# Patient Record
Sex: Female | Born: 1990
Health system: Southern US, Community
[De-identification: ages and names within clinical notes are randomized; demographics above are authoritative.]

## PROBLEM LIST (undated history)

## (undated) DIAGNOSIS — N809 Endometriosis, unspecified: Secondary | ICD-10-CM

## (undated) HISTORY — PX: CHOLECYSTECTOMY: SHX55

## (undated) HISTORY — DX: Endometriosis, unspecified: N80.9

---

## 2013-07-07 ENCOUNTER — Ambulatory Visit (INDEPENDENT_AMBULATORY_CARE_PROVIDER_SITE_OTHER): Payer: BC Managed Care – PPO | Admitting: Family Medicine

## 2013-07-07 ENCOUNTER — Ambulatory Visit: Payer: BC Managed Care – PPO

## 2013-07-07 VITALS — BP 118/60 | HR 78 | Resp 16

## 2013-07-07 DIAGNOSIS — S99919A Unspecified injury of unspecified ankle, initial encounter: Secondary | ICD-10-CM

## 2013-07-07 DIAGNOSIS — S8990XA Unspecified injury of unspecified lower leg, initial encounter: Secondary | ICD-10-CM

## 2013-07-07 DIAGNOSIS — S99911A Unspecified injury of right ankle, initial encounter: Secondary | ICD-10-CM

## 2013-07-07 DIAGNOSIS — S93409A Sprain of unspecified ligament of unspecified ankle, initial encounter: Secondary | ICD-10-CM

## 2013-07-07 DIAGNOSIS — S99929A Unspecified injury of unspecified foot, initial encounter: Secondary | ICD-10-CM

## 2013-07-07 DIAGNOSIS — S93401A Sprain of unspecified ligament of right ankle, initial encounter: Secondary | ICD-10-CM

## 2013-07-07 DIAGNOSIS — M25579 Pain in unspecified ankle and joints of unspecified foot: Secondary | ICD-10-CM

## 2013-07-07 NOTE — Patient Instructions (Signed)
Take either ibuprofen 6-800 mg 3 times daily or Aleve (naproxen) 2 twice daily as needed for pain  Elevate foot as possible  Wear a Cam Walker for a couple of weeks. If much better in 10-14 days may stop wearing the Cam Walker. However if the pain continues to be significant at that point you should return for a recheck.  Use ice on ankle for 4- 5 times daily for 15 or 20 minutes.  Return at any time if worse

## 2013-07-07 NOTE — Progress Notes (Signed)
Subjective: Patient stepped off a curb and fell, hearing a pop in her right ankle. She has a lot of pain. She did take some ibuprofen promptly. She is a [employer] student. A friend drove her here. She is on medicine for endometriosis, does not menstruate, and has no concern or risk of pregnancy.  Objective:  Very painful right ankle. She feels better with an elevated. She's very tender on the lateral collateral malleolus. Pedal pulses are good. Sensation normal. Some early ecchymosis below the lateral malleolus.  Assessment: Ankle pain  Plan: X-ray ankle  [facility] reading (PRIMARY) by  Dr. Alwyn RenHopper Normal ankle  Cam Dan HumphreysWalker. Then we will see if she needs crutches..Marland Kitchen

## 2014-08-19 IMAGING — CR DG ANKLE COMPLETE 3+V*R*
2 series · 2 of 2 positions shown · non-contrast
Comparison: None.

CLINICAL DATA: Pain post trauma

EXAM:
RIGHT ANKLE - COMPLETE 3+ VIEW

[AP (1 of 2)]
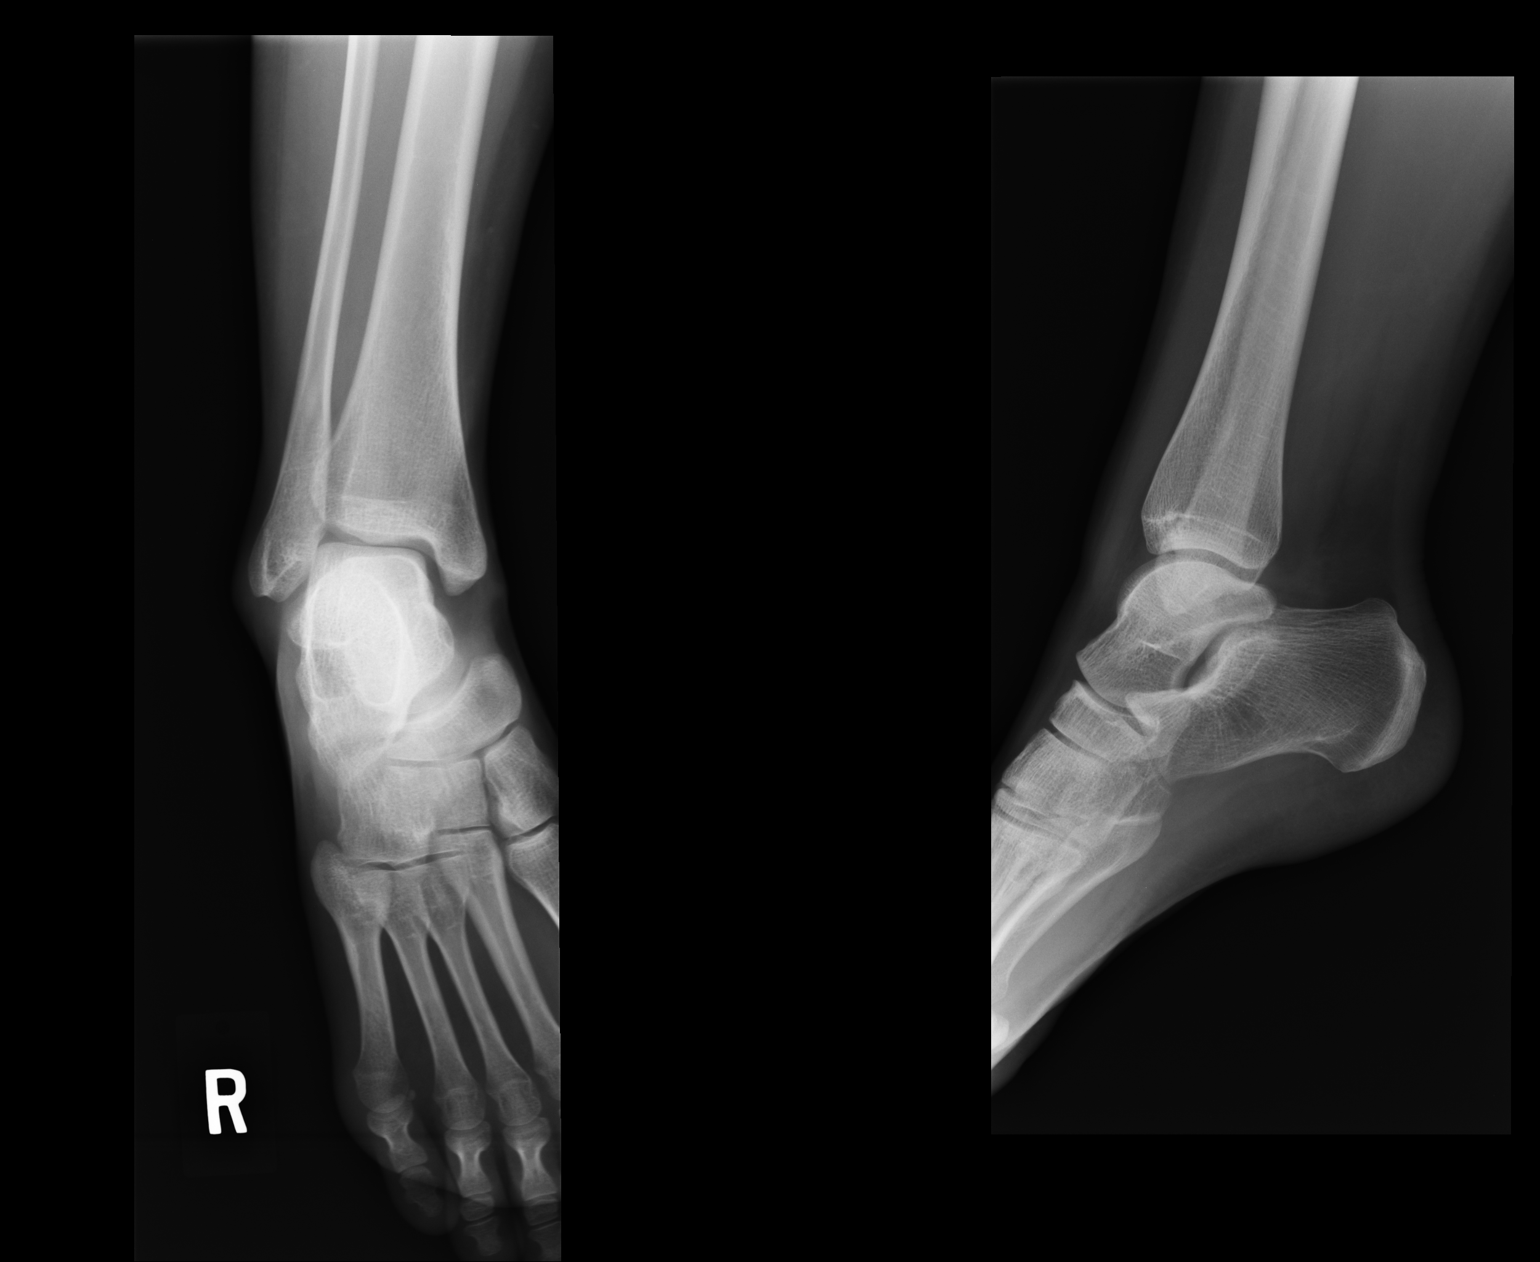

[AP (2 of 2)]
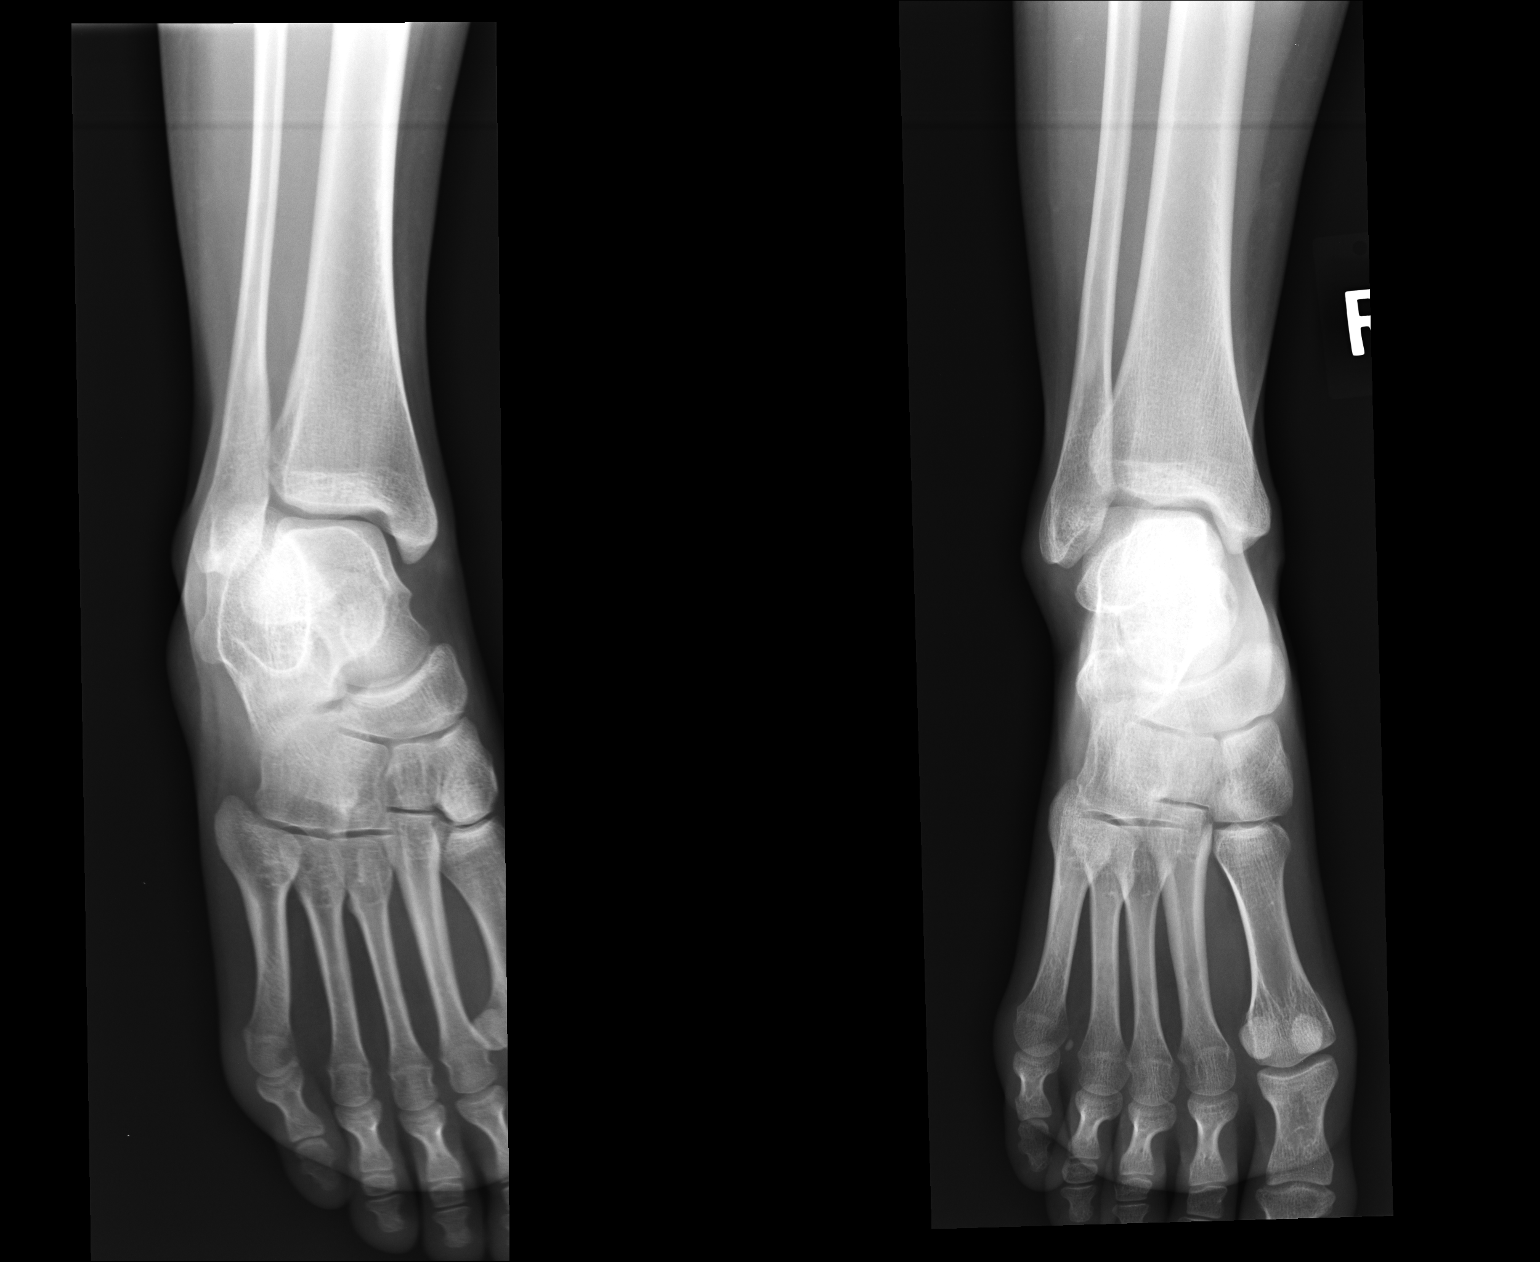

[2 of 2 positions shown; findings below may reference images not displayed]

FINDINGS: Frontal, oblique, and lateral views were obtained. There is no
fracture or effusion. Ankle mortise appears intact.
IMPRESSION: No abnormality noted.

## 2017-06-26 ENCOUNTER — Encounter: Payer: Self-pay | Admitting: Family Medicine

## 2017-06-26 ENCOUNTER — Ambulatory Visit (INDEPENDENT_AMBULATORY_CARE_PROVIDER_SITE_OTHER): Payer: 59 | Admitting: Family Medicine

## 2017-06-26 VITALS — BP 118/70 | HR 71 | Temp 97.9°F | Resp 12 | Ht 66.0 in | Wt 144.4 lb

## 2017-06-26 DIAGNOSIS — Z131 Encounter for screening for diabetes mellitus: Secondary | ICD-10-CM | POA: Diagnosis not present

## 2017-06-26 DIAGNOSIS — N809 Endometriosis, unspecified: Secondary | ICD-10-CM | POA: Diagnosis not present

## 2017-06-26 DIAGNOSIS — H538 Other visual disturbances: Secondary | ICD-10-CM | POA: Diagnosis not present

## 2017-06-26 LAB — POCT GLYCOSYLATED HEMOGLOBIN (HGB A1C): Hemoglobin A1C: 5.1

## 2017-06-26 NOTE — Patient Instructions (Addendum)
A few things to remember from today's visit:   Blurry vision, bilateral - Plan: POCT glycosylated hemoglobin (Hb A1C)  Diabetes mellitus screening  You may need eye glasses, so please arrange eye exam with eye care provider.Marland Kitchen.  Keep appt with gyn.  Lab Results  Component Value Date   HGBA1C 5.1 06/26/2017    Please be sure medication list is accurate. If a new problem present, please set up appointment sooner than planned today.

## 2017-06-26 NOTE — Progress Notes (Signed)
HPI:   [name] is a 27 y.o. female, who is here today to establish care.  Former PCP: Dr Harle Stanford at [place] ,[place] Last preventive routine visit: 2017  Chronic medical problems: Otherwise healthy except for history of endometriosis, overall well control on OCPs.  She has not had a pap smear. Vaccinations reported as up to date, Tdap a couple years ago.  Concerns today: Blurry vision, she would like a vision screening.  For about a year she has noted blurry vision,contant and just with distant vision. She does not have problems with reading or working on computers. She has difficulty reading signs that are far away. Gradual onset. Problem has bee stable. No associated headache,eye pain or discharge,conjunctival erythema,eye dryness or pruritus. No nasal congestion or rhinorrhea. She has not tried OTC eye products.   Review of Systems  Constitutional: Negative for activity change, appetite change, fatigue and fever.  HENT: Negative for mouth sores, nosebleeds and trouble swallowing.   Eyes: Positive for visual disturbance. Negative for pain, discharge, redness and itching.  Respiratory: Negative for cough, shortness of breath and wheezing.   Cardiovascular: Negative for chest pain, palpitations and leg swelling.  Gastrointestinal: Negative for abdominal pain, nausea and vomiting.       Negative for changes in bowel habits.  Endocrine: Negative for polydipsia, polyphagia and polyuria.  Skin: Negative for rash.  Allergic/Immunologic: Negative for environmental allergies.  Neurological: Negative for syncope, weakness and headaches.      Current Outpatient Medications on File Prior to Visit  Medication Sig Dispense Refill  . WYMZYA FE 0.4-35 MG-MCG tablet      No current facility-administered medications on file prior to visit.      Past Medical History:  Diagnosis Date  . Endometriosis    Allergies  Allergen Reactions  . Other Rash    Anything  in the cillin family. Nausea, vomiting  . Shellfish Allergy Other (See Comments) and Shortness Of Breath    Other reaction(s): Itching, Rash -cillins  . Cefuroxime Axetil     Other reaction(s): Itching    Family History  Problem Relation Age of Onset  . Diabetes Mother     Social History   Socioeconomic History  . Marital status: Single    Spouse name: None  . Number of children: None  . Years of education: None  . Highest education level: None  Social Needs  . Financial resource strain: None  . Food insecurity - worry: None  . Food insecurity - inability: None  . Transportation needs - medical: None  . Transportation needs - non-medical: None  Occupational History  . None  Tobacco Use  . Smoking status: Never Smoker  . Smokeless tobacco: Never Used  Substance and Sexual Activity  . Alcohol use: Yes  . Drug use: No  . Sexual activity: No  Other Topics Concern  . None  Social History Narrative  . None    Vitals:   06/26/17 1220  BP: 118/70  Pulse: 71  Resp: 12  Temp: 97.9 F (36.6 C)  SpO2: 98%    Body mass index is 23.3 kg/m.   Physical Exam  Nursing note and vitals reviewed. Constitutional: She is oriented to person, place, and time. She appears well-developed and well-nourished. No distress.  HENT:  Head: Normocephalic and atraumatic.  Mouth/Throat: Oropharynx is clear and moist and mucous membranes are normal.  Eyes: Conjunctivae and EOM are normal. Pupils are equal, round, and reactive to light.  Neck: No tracheal deviation present. No thyroid mass and no thyromegaly present.  Cardiovascular: Normal rate and regular rhythm.  No murmur heard. Respiratory: Effort normal and breath sounds normal. No respiratory distress.  GI: Soft. She exhibits no mass. There is no hepatomegaly. There is no tenderness.  Musculoskeletal: She exhibits no edema or tenderness.  Lymphadenopathy:    She has no cervical adenopathy.  Neurological: She is alert and  oriented to person, place, and time. She has normal strength. Coordination normal.  Skin: Skin is warm. No rash noted. No erythema.  Psychiatric: She has a normal mood and affect.  Well groomed, good eye contact.    ASSESSMENT AND PLAN:   Ms. Sheri Pratt was seen today for establish care.  Diagnoses and all orders for this visit:  Lab Results  Component Value Date   HGBA1C 5.1 06/26/2017    Blurry vision, bilateral  Possible etiologies discussed, stable overall. ? Refractive eye problem. Vision screening normal but had some difficulty reading 20/20. Still strongly recommend evaluation by eye care provider.    Visual Acuity Screening   Right eye Left eye Both eyes  Without correction: 20/20 20/20 20/20   With correction:      Instructed about warning signs.  -     POCT glycosylated hemoglobin (Hb A1C)  Diabetes mellitus screening -     POCT glycosylated hemoglobin (Hb A1C)  Endometriosis  Stable. She has an appt with gyn next week.   We dicussed current recommendations in regard to cervical cancer screening.     Jase Himmelberger G. [place], MD  [facility]. [place] office.

## 2017-07-03 DIAGNOSIS — Z6823 Body mass index (BMI) 23.0-23.9, adult: Secondary | ICD-10-CM | POA: Diagnosis not present

## 2017-07-03 DIAGNOSIS — Z01419 Encounter for gynecological examination (general) (routine) without abnormal findings: Secondary | ICD-10-CM | POA: Diagnosis not present

## 2017-07-15 ENCOUNTER — Ambulatory Visit (INDEPENDENT_AMBULATORY_CARE_PROVIDER_SITE_OTHER): Payer: 59 | Admitting: Family Medicine

## 2017-07-15 ENCOUNTER — Encounter: Payer: Self-pay | Admitting: *Deleted

## 2017-07-15 ENCOUNTER — Encounter: Payer: Self-pay | Admitting: Family Medicine

## 2017-07-15 VITALS — BP 116/72 | HR 111 | Temp 98.9°F | Resp 12 | Ht 66.0 in | Wt 146.2 lb

## 2017-07-15 DIAGNOSIS — R509 Fever, unspecified: Secondary | ICD-10-CM | POA: Diagnosis not present

## 2017-07-15 DIAGNOSIS — J069 Acute upper respiratory infection, unspecified: Secondary | ICD-10-CM

## 2017-07-15 DIAGNOSIS — R112 Nausea with vomiting, unspecified: Secondary | ICD-10-CM | POA: Diagnosis not present

## 2017-07-15 DIAGNOSIS — J029 Acute pharyngitis, unspecified: Secondary | ICD-10-CM | POA: Diagnosis not present

## 2017-07-15 LAB — POC INFLUENZA A&B (BINAX/QUICKVUE)
INFLUENZA A, POC: NEGATIVE
INFLUENZA B, POC: NEGATIVE

## 2017-07-15 LAB — POCT RAPID STREP A (OFFICE): Rapid Strep A Screen: NEGATIVE

## 2017-07-15 MED ORDER — ONDANSETRON HCL 4 MG PO TABS: 4.0000 mg | ORAL_TABLET | Freq: Three times a day (TID) | ORAL | 0 refills | Status: DC | PRN

## 2017-07-15 MED ORDER — MAGIC MOUTHWASH W/LIDOCAINE: 5.0000 mL | Freq: Three times a day (TID) | ORAL | 0 refills | Status: AC | PRN

## 2017-07-15 MED ORDER — BENZONATATE 100 MG PO CAPS: 200.0000 mg | ORAL_CAPSULE | Freq: Two times a day (BID) | ORAL | 0 refills | Status: AC | PRN

## 2017-07-15 NOTE — Progress Notes (Signed)
ACUTE VISIT  HPI:  Chief Complaint  Patient presents with  . Cough  . Nasal Congestion  . Sore Throat  . Fever    started Sunday, 101.8 this morning  . Emesis    on Tuesday  . Generalized Body Aches    [name] is a 27 y.o.female here today complaining of 3 days of fever. Yesterday episode of vomiting. Still having mild nausea. No abdominal pain or diarrhea.  Nausea aggravated by food intake. No alleviated factors identified.   URI   This is a new problem. The current episode started in the past 7 days. The problem has been unchanged. The maximum temperature recorded prior to her arrival was 101 - 101.9 F. Associated symptoms include congestion, coughing, nausea, a plugged ear sensation, rhinorrhea, a sore throat and vomiting. Pertinent negatives include no abdominal pain, diarrhea, dysuria, ear pain, headaches, neck pain, rash, swollen glands or wheezing. She has tried NSAIDs for the symptoms. The treatment provided mild relief.    No Hx of recent travel. 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 No known insect bite.  Hx of allergies: No  Symptoms otherwise stable.    Review of Systems  Constitutional: Positive for activity change, appetite change, chills, fatigue and fever.  HENT: Positive for congestion, postnasal drip, rhinorrhea, sinus pressure and sore throat. Negative for ear pain, mouth sores, trouble swallowing and voice change.   Eyes: Negative for discharge, redness and itching.  Respiratory: Positive for cough. Negative for shortness of breath and wheezing.   Cardiovascular: Negative for leg swelling.  Gastrointestinal: Positive for nausea and vomiting. Negative for abdominal pain and diarrhea.  Genitourinary: Negative for dysuria.  Musculoskeletal: Positive for myalgias. Negative for gait problem and neck pain.  Skin: Negative for rash.  Allergic/Immunologic: Negative for environmental allergies.  Neurological: Negative for  syncope, weakness and headaches.  Hematological: Negative for adenopathy. Does not bruise/bleed easily.      Current Outpatient Medications on File Prior to Visit  Medication Sig Dispense Refill  . WYMZYA FE 0.4-35 MG-MCG tablet      No current facility-administered medications on file prior to visit.      Past Medical History:  Diagnosis Date  . Endometriosis    Allergies  Allergen Reactions  . Other Rash    Anything in the cillin family. Nausea, vomiting  . Cefuroxime Axetil     Other reaction(s): Itching    Social History   Socioeconomic History  . Marital status: Single    Spouse name: None  . Number of children: None  . Years of education: None  . Highest education level: None  Social Needs  . Financial resource strain: None  . Food insecurity - worry: None  . Food insecurity - inability: None  . Transportation needs - medical: None  . Transportation needs - non-medical: None  Occupational History  . None  Tobacco Use  . Smoking status: Never Smoker  . Smokeless tobacco: Never Used  Substance and Sexual Activity  . Alcohol use: Yes  . Drug use: No  . Sexual activity: No  Other Topics Concern  . None  Social History Narrative  . None    Vitals:   07/15/17 1036  BP: 116/72  Pulse: (!) 111  Resp: 12  Temp: 98.9 F (37.2 C)  SpO2: 99%   Body mass index is 23.61 kg/m.    Physical Exam  Nursing note and vitals reviewed. Constitutional: She is oriented  to person, place, and time. She appears well-developed and well-nourished. She does not appear ill. No distress.  HENT:  Head: Normocephalic and atraumatic.  Right Ear: Tympanic membrane, external ear and ear canal normal.  Left Ear: Tympanic membrane, external ear and ear canal normal.  Nose: Rhinorrhea and septal deviation present. Right sinus exhibits no maxillary sinus tenderness and no frontal sinus tenderness. Left sinus exhibits no maxillary sinus tenderness and no frontal sinus  tenderness.  Mouth/Throat: Uvula is midline and mucous membranes are normal. Posterior oropharyngeal erythema present. No oropharyngeal exudate or posterior oropharyngeal edema.  Hypertrophic turbinates. Post nasal drainage.  Eyes: Conjunctivae are normal.  Neck: No muscular tenderness present. No edema and no erythema present.  Cardiovascular: Regular rhythm. Tachycardia present.  No murmur heard. Respiratory: Effort normal and breath sounds normal. No stridor. No respiratory distress.  Lymphadenopathy:    She has cervical adenopathy.       Right cervical: Posterior cervical adenopathy present.       Left cervical: Posterior cervical adenopathy present.  Neurological: She is alert and oriented to person, place, and time. She has normal strength.  Skin: Skin is warm. No rash noted. No erythema.  Psychiatric: She has a normal mood and affect. Her speech is normal.  Well groomed, good eye contact.     ASSESSMENT AND PLAN:   Ms. [name] was seen today for cough, nasal congestion, sore throat, fever, emesis and generalized body aches.  Diagnoses and all orders for this visit:  Sore throat  Rapid strep negative. We will follow strep Cx. Symptomatic treatment recommended.  -     POC Influenza A&B (Binax test) -     POCT rapid strep A -     Culture, Group A Strep -     magic mouthwash w/lidocaine SOLN; Take 5 mLs by mouth 3 (three) times daily as needed for up to 10 days for mouth pain. 50 ml of diphenhydramine, alum and mag hydroxide, and lidocaine to make 150 ml  Fever, unspecified fever cause  Rapid flu and strep negative.  -     POC Influenza A&B (Binax test) -     POCT rapid strep A -     Culture, Group A Strep  URI, acute  Symptoms suggests a viral etiology, symptomatic treatment recommended. Istructed to monitor for signs of complications, clearly instructed about warning signs. Excuse note for work given. I also explained that cough and nasal congestion can last a  few days and sometimes weeks. F/U as needed.   -     POC Influenza A&B (Binax test) -     POCT rapid strep A -     Culture, Group A Strep -     benzonatate (TESSALON) 100 MG capsule; Take 2 capsules (200 mg total) by mouth 2 (two) times daily as needed for up to 10 days.  Nausea and vomiting in adult  Zofran recommended. Small and frequent sips of clear fluids during the day. Instructed about warning signs.  -     ondansetron (ZOFRAN) 4 MG tablet; Take 1 tablet (4 mg total) by mouth every 8 (eight) hours as needed for nausea or vomiting.      -Ms. Devonia Farro Richison was advised to seek attention immediately if symptoms worsen or to follow if they persist or new concerns arise.       Betty G. [place], MD  [facility]. [place] office.

## 2017-07-15 NOTE — Patient Instructions (Addendum)
A few things to remember from today's visit:   Sore throat - Plan: POC Influenza A&B (Binax test), POCT rapid strep A, Culture, Group A Strep, magic mouthwash w/lidocaine SOLN  Fever, unspecified fever cause - Plan: POC Influenza A&B (Binax test), POCT rapid strep A, Culture, Group A Strep  URI, acute - Plan: POC Influenza A&B (Binax test), POCT rapid strep A, Culture, Group A Strep, benzonatate (TESSALON) 100 MG capsule  Nausea and vomiting in adult - Plan: ondansetron (ZOFRAN) 4 MG tablet  viral infections are self-limited and we treat each symptom depending of severity.  Over the counter medications as decongestants and cold medications usually help, they need to be taken with caution if there is a history of high blood pressure or palpitations. Tylenol and/or Ibuprofen also helps with most symptoms (headache, muscle aching, fever,etc) Plenty of fluids. Honey helps with cough. Steam inhalations helps with runny nose, nasal congestion, and may prevent sinus infections. Cough and nasal congestion could last a few days and sometimes weeks. Please follow in not any better in 1-2 weeks or if symptoms get worse.  Please be sure medication list is accurate. If a new problem present, please set up appointment sooner than planned today.

## 2017-07-17 LAB — CULTURE, GROUP A STREP
MICRO NUMBER: 90224601
SPECIMEN QUALITY:: ADEQUATE

## 2018-07-15 MED FILL — NORET-ESTR-FE 0.4-0.035(21): 0.4-35 | 84 days supply | Qty: 84 | Fill #0

## 2018-07-23 ENCOUNTER — Telehealth: Payer: 59 | Admitting: Family

## 2018-07-23 DIAGNOSIS — J069 Acute upper respiratory infection, unspecified: Secondary | ICD-10-CM

## 2018-07-23 MED ORDER — FLUTICASONE PROPIONATE 50 MCG/ACT NA SUSP: 1.0000 | Freq: Two times a day (BID) | NASAL | 6 refills | Status: DC

## 2018-07-23 MED ORDER — BENZONATATE 100 MG PO CAPS: 100.0000 mg | ORAL_CAPSULE | Freq: Three times a day (TID) | ORAL | 0 refills | Status: DC | PRN

## 2018-07-23 MED FILL — BENZONATATE 100 MG CAPS: 100 | 5 days supply | Qty: 30 | Fill #0

## 2018-07-23 MED FILL — FLUTICASONE PROP 50 MCG SPR: 50 | 30 days supply | Qty: 16 | Fill #0

## 2018-07-23 NOTE — Progress Notes (Signed)
Greater than 5 minutes, yet less than 10 minutes of time have been spent researching, coordinating, and implementing care for this patient today.  Thank you for the details you included in the comment boxes. Those details are very helpful in determining the best course of treatment for you and help [place] to provide the best care.  This is more likely to be another viral respiratory infection. See below.  We are sorry you are not feeling well.  Here is how we plan to help!  Based on what you have shared with me, it looks like you may have a viral upper respiratory infection.  Upper respiratory infections are caused by a large number of viruses; however, rhinovirus is the most common cause.   Symptoms vary from person to person, with common symptoms including sore throat, cough, and fatigue or lack of energy.  A low-grade fever of up to 100.4 may present, but is often uncommon.  Symptoms vary however, and are closely related to a person's age or underlying illnesses.  The most common symptoms associated with an upper respiratory infection are nasal discharge or congestion, cough, sneezing, headache and pressure in the ears and face.  These symptoms usually persist for about 3 to 10 days, but can last up to 2 weeks.  It is important to know that upper respiratory infections do not cause serious illness or complications in most cases.    Upper respiratory infections can be transmitted from person to person, with the most common method of transmission being a person's hands.  The virus is able to live on the skin and can infect other persons for up to 2 hours after direct contact.  Also, these can be transmitted when someone coughs or sneezes; thus, it is important to cover the mouth to reduce this risk.  To keep the spread of the illness at bay, good hand hygiene is very important.  This is an infection that is most likely caused by a virus. There are no specific treatments other than to help you with the  symptoms until the infection runs its course.  We are sorry you are not feeling well.  Here is how we plan to help!   For nasal congestion, you may use an oral decongestants such as Mucinex D or if you have glaucoma or high blood pressure use plain Mucinex.  Saline nasal spray or nasal drops can help and can safely be used as often as needed for congestion.  For your congestion, I have prescribed Fluticasone nasal spray one spray in each nostril twice a day  If you do not have a history of heart disease, hypertension, diabetes or thyroid disease, prostate/bladder issues or glaucoma, you may also use Sudafed to treat nasal congestion.  It is highly recommended that you consult with a pharmacist or your primary care physician to ensure this medication is safe for you to take.     If you have a cough, you may use cough suppressants such as Delsym and Robitussin.  If you have glaucoma or high blood pressure, you can also use Coricidin HBP.   For cough I have prescribed for you A prescription cough medication called Tessalon Perles 100 mg. You may take 1-2 capsules every 8 hours as needed for cough  If you have a sore or scratchy throat, use a saltwater gargle-  to  teaspoon of salt dissolved in a 4-ounce to 8-ounce glass of warm water.  Gargle the solution for approximately 15-30 seconds and then spit.  It is important not to swallow the solution.  You can also use throat lozenges/cough drops and Chloraseptic spray to help with throat pain or discomfort.  Warm or cold liquids can also be helpful in relieving throat pain.  For headache, pain or general discomfort, you can use Ibuprofen or Tylenol as directed.   Some authorities believe that zinc sprays or the use of Echinacea may shorten the course of your symptoms.   HOME CARE . Only take medications as instructed by your medical team. . Be sure to drink plenty of fluids. Water is fine as well as fruit juices, sodas and electrolyte beverages. You may  want to stay away from caffeine or alcohol. If you are nauseated, try taking small sips of liquids. How do you know if you are getting enough fluid? Your urine should be a pale yellow or almost colorless. . Get rest. . Taking a steamy shower or using a humidifier may help nasal congestion and ease sore throat pain. You can place a towel over your head and breathe in the steam from hot water coming from a faucet. . Using a saline nasal spray works much the same way. . Cough drops, hard candies and sore throat lozenges may ease your cough. . Avoid close contacts especially the very young and the elderly . Cover your mouth if you cough or sneeze . Always remember to wash your hands.   GET HELP RIGHT AWAY IF: . You develop worsening fever. . If your symptoms do not improve within 10 days . You develop yellow or green discharge from your nose over 3 days. . You have coughing fits . You develop a severe head ache or visual changes. . You develop shortness of breath, difficulty breathing or start having chest pain . Your symptoms persist after you have completed your treatment plan  MAKE SURE YOU   Understand these instructions.  Will watch your condition.  Will get help right away if you are not doing well or get worse.  Your e-visit answers were reviewed by a board certified advanced clinical practitioner to complete your personal care plan. Depending upon the condition, your plan could have included both over the counter or prescription medications. Please review your pharmacy choice. If there is a problem, you may call our nursing hot line at and have the prescription routed to another pharmacy. Your safety is important to [place]. If you have drug allergies check your prescription carefully.   You can use MyChart to ask questions about today's visit, request a non-urgent call back, or ask for a work or school excuse for 24 hours related to this e-Visit. If it has been greater than 24 hours you  will need to follow up with your provider, or enter a new e-Visit to address those concerns. You will get an e-mail in the next two days asking about your experience.  I hope that your e-visit has been valuable and will speed your recovery. Thank you for using e-visits.

## 2018-11-25 MED FILL — NORET-ESTR-FE 0.4-0.035(21): 0.4-35 | 63 days supply | Qty: 84 | Fill #1

## 2019-02-08 MED FILL — NORET-ESTR-FE 0.4-0.035(21): 0.4-35 | 63 days supply | Qty: 84 | Fill #2

## 2019-04-28 MED FILL — WYMZYA FE CHEWABLE TABLET: 0.4-35 | 63 days supply | Qty: 84 | Fill #3

## 2019-07-22 MED FILL — WYMZYA FE CHEWABLE TABLET: 0.4-35 | 21 days supply | Qty: 28 | Fill #0

## 2019-08-16 MED FILL — WYMZYA FE 0.4-35 MG-MCG CHE: 0.4-35 | 21 days supply | Qty: 28 | Fill #0

## 2019-09-20 MED FILL — WYMZYA FE 0.4-35 MG-MCG CHE: 0.4-35 | 21 days supply | Qty: 28 | Fill #1

## 2019-09-21 ENCOUNTER — Other Ambulatory Visit (HOSPITAL_COMMUNITY): Payer: Self-pay | Admitting: Obstetrics

## 2019-10-25 MED FILL — WYMZYA FE 0.4-35 MG-MCG CHE: 0.4-35 | 84 days supply | Qty: 112 | Fill #0

## 2020-02-06 MED FILL — WYMZYA FE 0.4-35 MG-MCG CHE: 0.4-35 | 84 days supply | Qty: 112 | Fill #1

## 2020-04-03 ENCOUNTER — Ambulatory Visit: Payer: No Typology Code available for payment source | Attending: Internal Medicine

## 2020-04-03 DIAGNOSIS — Z23 Encounter for immunization: Secondary | ICD-10-CM

## 2020-04-03 NOTE — Progress Notes (Signed)
   Covid-19 Vaccination Clinic  Name:  Sheri Pratt    MRN: [id number] DOB: 1990/09/18  04/03/2020  [name] was observed post Covid-19 immunization for 15 minutes without incident. She was provided with Vaccine Information Sheet and instruction to access the V-Safe system.   [name] was instructed to call 911 with any severe reactions post vaccine: Marland Kitchen Difficulty breathing  . Swelling of face and throat  . A fast heartbeat  . A bad rash all over body  . Dizziness and weakness

## 2020-05-24 MED FILL — WYMZYA FE 0.4-35 MG-MCG CHE: 0.4-35 | 84 days supply | Qty: 112 | Fill #2

## 2020-09-07 ENCOUNTER — Other Ambulatory Visit (HOSPITAL_COMMUNITY): Payer: Self-pay

## 2020-09-07 MED FILL — Norethindrone & Ethinyl Estradiol-Fe Chew Tab 0.4 MG-35 MCG: ORAL | 84 days supply | Qty: 112 | Fill #0 | Status: AC

## 2020-09-10 ENCOUNTER — Other Ambulatory Visit (HOSPITAL_COMMUNITY): Payer: Self-pay

## 2020-09-11 ENCOUNTER — Other Ambulatory Visit (HOSPITAL_COMMUNITY): Payer: Self-pay

## 2020-10-03 ENCOUNTER — Other Ambulatory Visit (HOSPITAL_COMMUNITY): Payer: Self-pay

## 2020-10-03 MED ORDER — NORETHIN-ETH ESTRADIOL-FE 0.4-35 MG-MCG PO CHEW
1.0000 | CHEWABLE_TABLET | Freq: Every day | ORAL | 3 refills | Status: DC
  Filled 2020-10-03: qty 112, 84d supply, fill #0
  Filled 2020-12-28: qty 84, 84d supply, fill #0
  Filled 2021-03-20: qty 84, 63d supply, fill #1
  Filled 2021-06-12: qty 84, 63d supply, fill #2
  Filled 2021-09-07: qty 84, 63d supply, fill #3

## 2020-10-09 LAB — HM PAP SMEAR: HM Pap smear: NORMAL

## 2020-12-28 ENCOUNTER — Other Ambulatory Visit (HOSPITAL_COMMUNITY): Payer: Self-pay

## 2020-12-31 ENCOUNTER — Other Ambulatory Visit (HOSPITAL_COMMUNITY): Payer: Self-pay

## 2021-01-23 ENCOUNTER — Other Ambulatory Visit (HOSPITAL_COMMUNITY): Payer: Self-pay

## 2021-03-20 ENCOUNTER — Other Ambulatory Visit (HOSPITAL_COMMUNITY): Payer: Self-pay

## 2021-03-21 ENCOUNTER — Other Ambulatory Visit (HOSPITAL_COMMUNITY): Payer: Self-pay

## 2021-06-12 ENCOUNTER — Other Ambulatory Visit (HOSPITAL_COMMUNITY): Payer: Self-pay

## 2021-09-09 ENCOUNTER — Other Ambulatory Visit (HOSPITAL_COMMUNITY): Payer: Self-pay

## 2021-11-16 ENCOUNTER — Other Ambulatory Visit (HOSPITAL_COMMUNITY): Payer: Self-pay

## 2021-11-16 DIAGNOSIS — T7840XA Allergy, unspecified, initial encounter: Secondary | ICD-10-CM | POA: Insufficient documentation

## 2021-11-16 DIAGNOSIS — T63481A Toxic effect of venom of other arthropod, accidental (unintentional), initial encounter: Secondary | ICD-10-CM | POA: Insufficient documentation

## 2021-11-16 DIAGNOSIS — W57XXXA Bitten or stung by nonvenomous insect and other nonvenomous arthropods, initial encounter: Secondary | ICD-10-CM | POA: Insufficient documentation

## 2021-11-16 MED ORDER — BETAMETHASONE DIPROPIONATE 0.05 % EX CREA
TOPICAL_CREAM | CUTANEOUS | 0 refills | Status: DC
  Filled 2021-11-16: qty 15, 15d supply, fill #0

## 2021-11-19 ENCOUNTER — Other Ambulatory Visit (HOSPITAL_COMMUNITY): Payer: Self-pay

## 2021-11-19 MED ORDER — NORETHIN-ETH ESTRADIOL-FE 0.4-35 MG-MCG PO CHEW
1.0000 | CHEWABLE_TABLET | Freq: Every day | ORAL | 0 refills | Status: DC
  Filled 2021-11-19: qty 84, 63d supply, fill #0

## 2021-12-04 ENCOUNTER — Other Ambulatory Visit (HOSPITAL_COMMUNITY): Payer: Self-pay

## 2021-12-04 MED ORDER — NORETHIN-ETH ESTRADIOL-FE 0.4-35 MG-MCG PO CHEW
1.0000 | CHEWABLE_TABLET | Freq: Every day | ORAL | 0 refills | Status: DC
  Filled 2021-12-04: qty 112, 84d supply, fill #0
  Filled 2022-01-28: qty 84, 91d supply, fill #0
  Filled 2022-01-28: qty 28, 21d supply, fill #0
  Filled 2022-01-31: qty 56, 42d supply, fill #1
  Filled 2022-03-28: qty 56, 42d supply, fill #2

## 2022-01-28 ENCOUNTER — Other Ambulatory Visit (HOSPITAL_COMMUNITY): Payer: Self-pay

## 2022-01-29 ENCOUNTER — Other Ambulatory Visit (HOSPITAL_COMMUNITY): Payer: Self-pay

## 2022-01-29 MED ORDER — ATOVAQUONE-PROGUANIL HCL 250-100 MG PO TABS
1.0000 | ORAL_TABLET | Freq: Every day | ORAL | 0 refills | Status: DC
  Filled 2022-01-29: qty 18, 18d supply, fill #0

## 2022-01-30 ENCOUNTER — Other Ambulatory Visit (HOSPITAL_COMMUNITY): Payer: Self-pay

## 2022-01-31 ENCOUNTER — Other Ambulatory Visit (HOSPITAL_COMMUNITY): Payer: Self-pay

## 2022-02-03 ENCOUNTER — Other Ambulatory Visit (HOSPITAL_COMMUNITY): Payer: Self-pay

## 2022-02-04 ENCOUNTER — Other Ambulatory Visit (HOSPITAL_COMMUNITY): Payer: Self-pay

## 2022-02-04 MED ORDER — NORETHINDRONE 0.35 MG PO TABS
1.0000 | ORAL_TABLET | Freq: Every day | ORAL | 0 refills | Status: DC
  Filled 2022-02-04: qty 28, 28d supply, fill #0

## 2022-03-28 ENCOUNTER — Other Ambulatory Visit (HOSPITAL_COMMUNITY): Payer: Self-pay

## 2022-04-01 ENCOUNTER — Other Ambulatory Visit (HOSPITAL_COMMUNITY): Payer: Self-pay

## 2022-04-01 MED ORDER — NORETHIN-ETH ESTRADIOL-FE 0.4-35 MG-MCG PO CHEW
1.0000 | CHEWABLE_TABLET | Freq: Every day | ORAL | 2 refills | Status: AC
  Filled 2022-04-01: qty 112, 84d supply, fill #0
  Filled 2022-07-25: qty 112, 84d supply, fill #1
  Filled 2022-10-06: qty 112, 84d supply, fill #2

## 2022-04-09 ENCOUNTER — Ambulatory Visit: Payer: No Typology Code available for payment source | Admitting: Family Medicine

## 2022-04-11 ENCOUNTER — Encounter: Payer: Self-pay | Admitting: Internal Medicine

## 2022-04-11 ENCOUNTER — Ambulatory Visit (INDEPENDENT_AMBULATORY_CARE_PROVIDER_SITE_OTHER): Payer: No Typology Code available for payment source | Admitting: Internal Medicine

## 2022-04-11 VITALS — BP 120/69 | HR 74 | Temp 98.1°F | Resp 12 | Ht 66.0 in | Wt 146.8 lb

## 2022-04-11 DIAGNOSIS — Z23 Encounter for immunization: Secondary | ICD-10-CM

## 2022-04-11 DIAGNOSIS — Z0000 Encounter for general adult medical examination without abnormal findings: Secondary | ICD-10-CM | POA: Diagnosis not present

## 2022-04-11 DIAGNOSIS — Z8349 Family history of other endocrine, nutritional and metabolic diseases: Secondary | ICD-10-CM | POA: Diagnosis not present

## 2022-04-11 NOTE — Patient Instructions (Addendum)
It was a pleasure seeing you today! I truly hope you feel like you received 5 star service and please let me know if there is anything I can improve.  Lula Olszewski, MD   Today the plan is... Request pap record at checkout. Start regular calcium vitamin D supplement Try out proxy brushing vs flossing Try out sinus rinsing with sterile saline mist.  Preventative health care -     CBC -     Comprehensive metabolic panel -     Lipid panel  Family history of hypothyroidism -     TSH + free T4  Need for HPV vaccination -     HPV 9-valent vaccine,Recombinat  Need for Tdap vaccination -     Tdap vaccine greater than or equal to 7yo IM         [x]  RETURN TO CLINIC: Return in about 1 year (around 04/12/2023), or lab visit, for Annual Exam.   - If you are not doing well: RETURN to the office sooner. - Please bring all your medicines to each appointment.  - If your condition begins to worsen or become severe:  GO to the ER.  [x]  QUESTIONS/CONCERNS:  If you have follow-up questions / concerns:  - CLINICAL: please contact me via phone [phone] OR MyChart messaging  - LAB & IMAGING RESULTS you will be contacted with the lab results as soon as they are available. For any labs or imaging tests, we will call you if the results are significantly abnormal.  Most normal results will be posted to myChart as soon as they are available and I will comment on them there within 2-3 business days.  The fastest way to get your results is to activate your My Chart account. Instructions are located on the last page of this paperwork. If you have not heard from regarding the results in 2 weeks, please contact this office.  - BILLING: xray and lab orders are billed from separate companies and questions./concerns should be directed to the invoicing company.  For visit charges please discuss with our administrative services

## 2022-04-11 NOTE — Progress Notes (Signed)
[facility]  Phone: [phone]  New patient visit  Visit Date: 04/11/2022 Patient: Sheri Pratt   DOB: [birth year]   30 y.o. Female  MRN: [id number]  Today's healthcare provider: Lula Olszewski, MD  Assessment and Plan:   Almost comically healthy young lady living a nearly perfect lifestyle.  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 Only suggestions I could find to help make visit worthwhile were: Request pap record at checkout. Start regular calcium vitamin D supplement Try out proxy brushing vs flossing Try out sinus rinsing with sterile saline mist.  Sheri Pratt was seen today for tetanus booster, hypothyroidism lab, establish care and hpv vaccine.  Preventative health care -     CBC; Future -     Lipid panel; Future -     Comprehensive metabolic panel; Future  Family history of hypothyroidism -     TSH + free T4; Future  Need for HPV vaccination -     HPV 9-valent vaccine,Recombinat  Need for Tdap vaccination -     Tdap vaccine greater than or equal to 7yo IM   Completed All the Following Health Maintenance Counseling and Anticipatory Guidance:  #  Eye exams: Recommended 1-2 years. She goes and has insurance. #  Dental health: Recommended regular tooth brushing, flossing, and dental visits every 6 months-reports she goes #  Sinus health: Recommend nightly misting. #  Diet/Exercise:   recommended regular exercise (she does 2-3x weekly 1 hour resistance and cardiology) and diet rich and fruits and vegetables and healthy fats to reduce risk of heart attack and stroke.  Avoids fat due to gallstone cholecystectomy.   #  Sexuality: recommended STD prevention via partner selection & condoms.  Offered (if desired) contraception / STD checks / genital wart treatment.    #  Substance use: recommended absolute abstinence from all vaped or smoked recreational or illicit substances of abuse such as tobacco, nicotine, alcohol,  illicit drugs, even sugar.  She does a little and a little alcohol- 4-5 drinks a week.  #  Thyroid cancer screening: Suggest feeling for nodules in thyroid every 6-12 months and/or at office visits #  Cervical cancer screening: Recommended pap smear with hpv every 5 yrs from 25-65.  Offered gyn referral or completion here in accordance with patient preferences:  I support whatever increases the likelihood of her participating in screening.   #  Uterine cancer screening: Explained no guidelines but postmenopausal bleeding or very irregular bleeding should be reported. She reports menstruation fine and she sees gynecology also  #  Breast cancer screening:   last breast exam sum 2023 with her gynecologist and last mammogram never, and no family history breast cancer. #  Colon cancer screening:  Denies family history of colon cancer or any GI bleeding,  advised 40-61 year old we should discuss and plan for.  #  Skin cancer screening: recommended regular sunscreen use. she denies worrisome, changing, or new skin lesions.  Melanoma.   #  Osteoporosis screening at 65:  recommended to maintain a good source of calcium and vitamin D in diet.  She denies any personal history of early osteoporosis or fragility fractures to necessitate early DEXA screening.  Recommend weight bearing exercises and calcium and vitamin D. #  Health maintenance and immunizations reviewed and she was encouraged to complete any incomplete and release any missing immunization records for [place]  Health Maintenance Due  Topic Date Due   PAP SMEAR-Modifier  Never  done   INFLUENZA VACCINE  12/24/2021   HPV VACCINES (2 - 3-dose SCDM series) 05/09/2022   Will get her recent pap sent to [place] and is celibate  Up to date on everything else  Subjective:  Patient presents today to establish care.  Chief Complaint  Patient presents with   Tetanus booster   Hypothyroidism lab    Already ordered lab. Her dad has hypothyroidism (genetic), more  prone in females.   Establish Care   HPV vaccine    Depression Screen    04/11/2022    1:43 PM  PHQ 2/9 Scores  PHQ - 2 Score 0   The following were reviewed and entered/updated in epic: Past Medical History:  Diagnosis Date   Endometriosis    Past Surgical History:  Procedure Laterality Date   CHOLECYSTECTOMY     2016   Family History  Problem Relation Age of Onset   Diabetes Mother    Hypothyroidism Father    Outpatient Medications Prior to Visit  Medication Sig Dispense Refill   WYMZYA FE 0.4-35 MG-MCG tablet      atovaquone-proguanil (MALARONE) 250-100 MG TABS tablet Take 1 tablet by mouth daily with food, begin 2 days before travel, during, & 7 days after return (Patient not taking: Reported on 04/11/2022) 18 tablet 0   benzonatate (TESSALON PERLES) 100 MG capsule Take 1-2 capsules (100-200 mg total) by mouth every 8 (eight) hours as needed for cough. (Patient not taking: Reported on 04/11/2022) 30 capsule 0   betamethasone dipropionate 0.05 % cream Apply a small amount topically three times a day (Patient not taking: Reported on 04/11/2022) 15 g 0   fluticasone (FLONASE) 50 MCG/ACT nasal spray Place 1 spray into both nostrils 2 (two) times daily. (Patient not taking: Reported on 04/11/2022) 16 g 6   Norethin-Eth Estradiol-Fe [facility] FE) 0.4-35 MG-MCG tablet Chew 1 tablet by mouth daily.Continuous active tablets (Patient not taking: Reported on 04/11/2022) 112 tablet 2   norethindrone (CAMILA) 0.35 MG tablet Take 1 tablet (0.35 mg total) by mouth daily. (Patient not taking: Reported on 04/11/2022) 28 tablet 0   ondansetron (ZOFRAN) 4 MG tablet Take 1 tablet (4 mg total) by mouth every 8 (eight) hours as needed for nausea or vomiting. (Patient not taking: Reported on 04/11/2022) 15 tablet 0   No facility-administered medications prior to visit.    Allergies  Allergen Reactions   Other Rash    Anything in the cillin family. Nausea, vomiting   Penicillins Hives    Cefuroxime Axetil     Other reaction(s): Itching   Social History   Tobacco Use   Smoking status: Never   Smokeless tobacco: Never  Vaping Use   Vaping Use: Never used  Substance Use Topics   Alcohol use: Yes   Drug use: No    Immunization History  Administered Date(s) Administered   HPV 9-valent 04/11/2022   Influenza, Seasonal, Injecte, Preservative Fre 02/16/2015   Influenza,inj,quad, With Preservative 02/24/2016   PFIZER(Purple Top)SARS-COV-2 Vaccination 04/03/2020   PPD Test 01/07/2016   Td (Adult),unspecified 06/12/2005   Tdap 04/11/2022    Objective:  BP 120/69 (BP Location: Left Arm, Patient Position: Sitting)   Pulse 74   Temp 98.1 F (36.7 C) (Temporal)   Resp 12   Ht 5\' 6"  (1.676 m)   Wt 146 lb 12.8 oz (66.6 kg)   SpO2 99%   BMI 23.69 kg/m  Body mass index is 23.69 kg/m.   Physical Exam Vitals reviewed.  Constitutional:  General: She is not in acute distress.    Appearance: Normal appearance. She is not ill-appearing.     Comments: This is a very pleasant and polite person.    HENT:     Head: Normocephalic and atraumatic.     Right Ear: Tympanic membrane, ear canal and external ear normal.     Left Ear: Tympanic membrane, ear canal and external ear normal.     Nose: Nose normal.     Mouth/Throat:     Mouth: Mucous membranes are moist.  Eyes:     Conjunctiva/sclera: Conjunctivae normal.  Cardiovascular:     Rate and Rhythm: Normal rate and regular rhythm.     Heart sounds: Normal heart sounds. No murmur heard. Pulmonary:     Effort: Pulmonary effort is normal.     Breath sounds: Normal breath sounds.  Abdominal:     Palpations: Abdomen is soft. There is no mass.     Tenderness: There is no guarding.  Skin:    General: Skin is warm and dry.     Coloration: Skin is not jaundiced.     Findings: No bruising.  Neurological:     General: No focal deficit present.     Mental Status: She is alert.  Psychiatric:        Mood and Affect: Mood  normal.        Behavior: Behavior normal.        Thought Content: Thought content normal.        Judgment: Judgment normal.

## 2022-04-12 LAB — COMPREHENSIVE METABOLIC PANEL
AG Ratio: 1.5 (calc) (ref 1.0–2.5)
ALT: 22 U/L (ref 6–29)
AST: 19 U/L (ref 10–30)
Albumin: 4.6 g/dL (ref 3.6–5.1)
Alkaline phosphatase (APISO): 46 U/L (ref 31–125)
BUN: 13 mg/dL (ref 7–25)
CO2: 26 mmol/L (ref 20–32)
Calcium: 9.6 mg/dL (ref 8.6–10.2)
Chloride: 104 mmol/L (ref 98–110)
Creat: 0.78 mg/dL (ref 0.50–0.97)
Globulin: 3 g/dL (calc) (ref 1.9–3.7)
Glucose, Bld: 92 mg/dL (ref 65–99)
Potassium: 3.8 mmol/L (ref 3.5–5.3)
Sodium: 139 mmol/L (ref 135–146)
Total Bilirubin: 0.4 mg/dL (ref 0.2–1.2)
Total Protein: 7.6 g/dL (ref 6.1–8.1)

## 2022-04-12 LAB — CBC
HCT: 37.3 % (ref 35.0–45.0)
Hemoglobin: 12.9 g/dL (ref 11.7–15.5)
MCH: 31.2 pg (ref 27.0–33.0)
MCHC: 34.6 g/dL (ref 32.0–36.0)
MCV: 90.3 fL (ref 80.0–100.0)
MPV: 12.8 fL — ABNORMAL HIGH (ref 7.5–12.5)
Platelets: 210 10*3/uL (ref 140–400)
RBC: 4.13 10*6/uL (ref 3.80–5.10)
RDW: 12.1 % (ref 11.0–15.0)
WBC: 7.6 10*3/uL (ref 3.8–10.8)

## 2022-04-12 LAB — LIPID PANEL
Cholesterol: 171 mg/dL (ref <200)
HDL: 80 mg/dL (ref >=50)
LDL Cholesterol (Calc): 75 mg/dL (calc)
Non-HDL Cholesterol (Calc): 91 mg/dL (calc) (ref <130)
Total CHOL/HDL Ratio: 2.1 (calc) (ref <5.0)
Triglycerides: 84 mg/dL (ref <150)

## 2022-04-12 LAB — TSH+FREE T4: TSH W/REFLEX TO FT4: 1.53 mIU/L

## 2022-05-08 ENCOUNTER — Encounter: Payer: Self-pay | Admitting: Internal Medicine

## 2022-05-08 ENCOUNTER — Encounter: Payer: Self-pay | Admitting: *Deleted

## 2022-07-24 ENCOUNTER — Other Ambulatory Visit (HOSPITAL_COMMUNITY): Payer: Self-pay

## 2022-07-25 ENCOUNTER — Other Ambulatory Visit (HOSPITAL_COMMUNITY): Payer: Self-pay

## 2022-10-06 ENCOUNTER — Other Ambulatory Visit (HOSPITAL_COMMUNITY): Payer: Self-pay

## 2022-10-07 ENCOUNTER — Other Ambulatory Visit (HOSPITAL_COMMUNITY): Payer: Self-pay

## 2023-04-20 ENCOUNTER — Other Ambulatory Visit (HOSPITAL_COMMUNITY): Payer: Self-pay

## 2023-04-20 MED ORDER — NORETHIN-ETH ESTRADIOL-FE 0.4-35 MG-MCG PO CHEW
1.0000 | CHEWABLE_TABLET | Freq: Every day | ORAL | 3 refills | Status: AC
  Filled 2023-04-20 – 2023-05-07 (×2): qty 112, 84d supply, fill #0

## 2023-04-21 ENCOUNTER — Other Ambulatory Visit (HOSPITAL_COMMUNITY): Payer: Self-pay

## 2023-04-22 ENCOUNTER — Other Ambulatory Visit (HOSPITAL_COMMUNITY): Payer: Self-pay

## 2023-05-04 ENCOUNTER — Other Ambulatory Visit (HOSPITAL_COMMUNITY): Payer: Self-pay

## 2023-05-07 ENCOUNTER — Other Ambulatory Visit: Payer: Self-pay

## 2023-05-07 ENCOUNTER — Other Ambulatory Visit (HOSPITAL_COMMUNITY): Payer: Self-pay
